# Patient Record
Sex: Male | Born: 1965 | Race: White | Hispanic: No | Marital: Married | Smoking: Never smoker
Health system: Southern US, Community
[De-identification: ages and names within clinical notes are randomized; demographics above are authoritative.]

## PROBLEM LIST (undated history)

## (undated) DIAGNOSIS — G44229 Chronic tension-type headache, not intractable: Secondary | ICD-10-CM

## (undated) DIAGNOSIS — R51 Headache: Secondary | ICD-10-CM

## (undated) DIAGNOSIS — T7840XA Allergy, unspecified, initial encounter: Secondary | ICD-10-CM

## (undated) DIAGNOSIS — C649 Malignant neoplasm of unspecified kidney, except renal pelvis: Secondary | ICD-10-CM

## (undated) HISTORY — DX: Malignant neoplasm of unspecified kidney, except renal pelvis: C64.9

## (undated) HISTORY — PX: SEPTOPLASTY: SUR1290

## (undated) HISTORY — DX: Chronic tension-type headache, not intractable: G44.229

## (undated) HISTORY — DX: Allergy, unspecified, initial encounter: T78.40XA

## (undated) HISTORY — DX: Headache: R51

---

## 2009-07-17 ENCOUNTER — Ambulatory Visit: Payer: Self-pay | Admitting: Internal Medicine

## 2009-07-17 DIAGNOSIS — R51 Headache: Secondary | ICD-10-CM

## 2009-07-17 DIAGNOSIS — R519 Headache, unspecified: Secondary | ICD-10-CM | POA: Insufficient documentation

## 2009-07-17 DIAGNOSIS — G44229 Chronic tension-type headache, not intractable: Secondary | ICD-10-CM

## 2009-07-17 DIAGNOSIS — J309 Allergic rhinitis, unspecified: Secondary | ICD-10-CM | POA: Insufficient documentation

## 2009-07-26 ENCOUNTER — Telehealth: Payer: Self-pay | Admitting: Internal Medicine

## 2009-08-17 ENCOUNTER — Ambulatory Visit: Payer: Self-pay | Admitting: Internal Medicine

## 2009-10-30 ENCOUNTER — Emergency Department (HOSPITAL_COMMUNITY): Admission: EM | Admit: 2009-10-30 | Discharge: 2009-10-31 | Payer: Self-pay | Admitting: Emergency Medicine

## 2009-10-30 ENCOUNTER — Encounter: Payer: Self-pay | Admitting: Internal Medicine

## 2009-10-31 ENCOUNTER — Encounter: Payer: Self-pay | Admitting: Internal Medicine

## 2009-10-31 ENCOUNTER — Telehealth: Payer: Self-pay | Admitting: Internal Medicine

## 2009-11-07 ENCOUNTER — Encounter: Payer: Self-pay | Admitting: Internal Medicine

## 2009-11-09 ENCOUNTER — Encounter: Payer: Self-pay | Admitting: Internal Medicine

## 2009-11-10 ENCOUNTER — Encounter: Payer: Self-pay | Admitting: Internal Medicine

## 2009-11-10 ENCOUNTER — Ambulatory Visit: Payer: Self-pay | Admitting: Critical Care Medicine

## 2009-11-10 ENCOUNTER — Telehealth: Payer: Self-pay | Admitting: Internal Medicine

## 2009-11-10 DIAGNOSIS — R930 Abnormal findings on diagnostic imaging of skull and head, not elsewhere classified: Secondary | ICD-10-CM

## 2009-11-21 DIAGNOSIS — C649 Malignant neoplasm of unspecified kidney, except renal pelvis: Secondary | ICD-10-CM | POA: Insufficient documentation

## 2009-11-22 ENCOUNTER — Telehealth: Payer: Self-pay | Admitting: Critical Care Medicine

## 2009-11-28 ENCOUNTER — Encounter: Payer: Self-pay | Admitting: Critical Care Medicine

## 2009-11-30 ENCOUNTER — Telehealth (INDEPENDENT_AMBULATORY_CARE_PROVIDER_SITE_OTHER): Payer: Self-pay | Admitting: *Deleted

## 2009-12-04 ENCOUNTER — Encounter: Payer: Self-pay | Admitting: Internal Medicine

## 2009-12-29 ENCOUNTER — Encounter: Payer: Self-pay | Admitting: Internal Medicine

## 2010-03-07 ENCOUNTER — Encounter: Payer: Self-pay | Admitting: Internal Medicine

## 2010-03-21 ENCOUNTER — Encounter: Payer: Self-pay | Admitting: Internal Medicine

## 2010-04-11 ENCOUNTER — Encounter: Payer: Self-pay | Admitting: Internal Medicine

## 2010-05-08 NOTE — Letter (Signed)
Summary: [facility]   Imported By: Lester [place] 11/17/2009 10:22:21  _____________________________________________________________________  External Attachment:    Type:   Image     Comment:   External Document

## 2010-05-08 NOTE — Letter (Signed)
Summary: Call A Nurse  Call A Nurse   Imported By: Sherian Rein 11/09/2009 13:41:38  _____________________________________________________________________  External Attachment:    Type:   Image     Comment:   External Document

## 2010-05-08 NOTE — Op Note (Signed)
Summary: [facility]   Imported By: Lester [place] 12/18/2009 10:26:37  _____________________________________________________________________  External Attachment:    Type:   Image     Comment:   External Document

## 2010-05-08 NOTE — Assessment & Plan Note (Signed)
Summary: Pulmonary Consultation   Copy to:  Dr. Izola Price Primary Provider/Referring Provider:  [name]  CC:  Pulmonary Consult for abnormal CT Chest.  Pt also scheduled for nephrectomy on August 29.  Brian Henson  History of Present Illness: Pulmonary Consultation  45 YO WM with new onset R renal mass.  Pt noted onset hematuria and renal colic like pain July 2011.  W/U revealed on Abd/pelvic CT renal mass.  Lower portion of lungs showed atelectasis and crowding of lung markings.  Full CT Chest taken times two did not reveal metastatic disease.  The pt was referred to confirm any abnormalities on CT Chest.    The is scheduled for nephrectomy at [facility] in [place] later in month of August.    Pt has prior hx of allergies/hayfever/ rhinitis.  No specific pulmonary history. The pt denies dyspnea, chest pain, cough, fever, chills, sweats, wheezing.  There is no hemoptysis. Pt denies any significant sore throat, nasal congestion or excess secretions, fever, chills, sweats, unintended weight loss, pleurtic or exertional chest pain, orthopnea PND, or leg swelling Pt denies any increase in rescue therapy over baseline, denies waking up needing it or having any early am or nocturnal exacerbations of coughing/wheezing/or dyspnea.    Preventive Screening-Counseling & Management  Alcohol-Tobacco     Smoking Status: never  Current Medications (verified): 1)  Xyzal 5 Mg Tabs (Levocetirizine Dihydrochloride) .... As Needed 2)  Singulair 10 Mg Tabs (Montelukast Sodium) .... As Needed 3)  Veramyst 27.5 Mcg/spray Susp (Fluticasone Furoate) .... As Needed 4)  Amrix 15 Mg Xr24h-Cap (Cyclobenzaprine Hcl) .... One By Mouth At Bedtime For Tension in Scalp As Needed  Allergies (verified): 1)  ! 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 Family History High cholesterol Family History Hypertension brother-sathma  Social History: Reviewed history from 07/17/2009 and no changes required. Occupation: Pensions consultant Married Never Smoked Alcohol use-yes 1 weekly Drug use-no Regular exercise-no  Review of Systems  The patient denies shortness of breath with activity, shortness of breath at rest, productive cough, non-productive cough, coughing up blood, chest pain, irregular heartbeats, acid heartburn, indigestion, loss of appetite, weight change, abdominal pain, difficulty swallowing, sore throat, tooth/dental problems, headaches, nasal congestion/difficulty breathing through nose, sneezing, itching, ear ache, anxiety, depression, hand/feet swelling, joint stiffness or pain, rash, change in color of mucus, and fever.    Vital Signs:  Patient profile:   45 year old male Height:      72 inches Weight:      262.38 pounds BMI:     35.71 O2 Sat:      97 % on Room air Temp:     98.2 degrees F oral Pulse rate:   77 / minute BP sitting:   136 / 86  (right arm) Cuff size:   large  Vitals Entered By: [name] RN (November 21, 2009 1:37 PM)  O2 Flow:  Room air CC: Pulmonary Consult for abnormal CT Chest.  Pt also scheduled for nephrectomy on August 29.   Comments Medications reviewed with patient Daytime contact number verified with patient. [name] RN  November 21, 2009 1:32 PM    Physical Exam  General:  normal appearance.   Nose:  no deformity, discharge, inflammation, or lesions Mouth:  no deformity or lesions Neck:  no masses,  thyromegaly, or abnormal cervical nodes Chest Wall:  no deformities noted Lungs:  clear bilaterally to auscultation and percussion Heart:  regular rate and rhythm, S1, S2 without murmurs, rubs, gallops, or clicks Abdomen:  bowel sounds positive; abdomen soft and non-tender without masses, or  organomegaly Msk:  no deformity or scoliosis noted with normal posture Pulses:  pulses normal Extremities:  no clubbing, cyanosis, edema, or deformity noted Neurologic:  CN II-XII grossly intact with normal reflexes, coordination, muscle strength and tone Skin:  intact without lesions or rashes Cervical Nodes:  no significant adenopathy Axillary Nodes:  no significant adenopathy Inguinal Nodes:  no significant adenopathy Psych:  alert and cooperative; normal mood and affect; normal attention span and concentration   CT of Chest  Procedure date:  11/13/2009  Findings:      3.6 cm x  2.9cm R hilum mass kidney   Lung crowding due to poor inspiratory effort with mosaic pattern This appears to be just poor inspiratory effort or expiratory view  Impression & Recommendations:  Problem # 1:  ABNORMAL CHEST XRAY (ICD-793.1) Assessment Unchanged The abnormalities seen on CT Chest do not represent anything other than poor inspiratory effort and do not represent either pneumonitis or metastatic process.  I find no active lung disease in this patient currently and I feel he can safely undergo a R nephrectomy  plan cleared medically for planned surgery no further pulmonary workup or treatment indicated return as needed   Medications Added to Medication List This Visit: 1)  Xyzal 5 Mg Tabs (Levocetirizine dihydrochloride) .... As needed 2)  Singulair 10 Mg Tabs (Montelukast sodium) .... As needed 3)  Veramyst 27.5 Mcg/spray Susp (Fluticasone furoate) .... As needed 4)  Amrix 15 Mg Xr24h-cap (Cyclobenzaprine hcl) .... One by mouth at bedtime for tension in scalp as needed  Complete Medication List: 1)  Xyzal 5 Mg Tabs (Levocetirizine dihydrochloride) .... As needed 2)  Singulair 10 Mg Tabs (Montelukast sodium) .... As needed 3)  Veramyst 27.5 Mcg/spray Susp (Fluticasone furoate) .... As needed 4)  Amrix 15 Mg Xr24h-cap (Cyclobenzaprine hcl) .... One by mouth at bedtime for tension in scalp  as needed   Immunization History:  Influenza Immunization History:    Influenza:  historical (02/06/2009)   Appended Document: Pulmonary Consultation pls fax to :  Dr. Maurene Capes at [facility]  (? I wonder if this is not "Renae Fickle" instead of [place] ??? Phone # [phone] Fax# [phone]

## 2010-05-08 NOTE — Consult Note (Signed)
Summary: [facility]   Imported By: Sherian Rein 11/15/2009 14:50:32  _____________________________________________________________________  External Attachment:    Type:   Image     Comment:   External Document

## 2010-05-08 NOTE — Progress Notes (Signed)
----   Converted from flag ---- ---- 11/10/2009 9:06 AM, Verdell Face wrote: Dr Yetta Barre,  Ephriam Knuckles JR is requesting a recommendation for a pulmonary MD from ( a name of one you would recommend) you & referral if needed.     [phone] Thayer Ohm (wife) Elnita Maxwell ------------------------------     Follow-up for Phone Call       Follow-up by: [name],  November 10, 2009 9:09 AM  New Problems: ABNORMAL CHEST XRAY (ICD-793.1)   New Problems: ABNORMAL CHEST XRAY (ICD-793.1)

## 2010-05-08 NOTE — Progress Notes (Signed)
  Phone Note Other Incoming   Caller: Pt Summary of Call: Pt was seen last night in the ER. Due to blood in his urine. The ER ran test and discovered a mass on pt's right kidney. Pt [facility] ref[facility] to [facility] and has an appt this am at 9am. Just an FYI Initial call taken by: Ami Bullins CMA,  October 31, 2009 9:01 AM

## 2010-05-08 NOTE — Miscellaneous (Signed)
Summary: Orders Update  Clinical Lists Changes  Orders: Added new Service order of New Patient Level V [phone]) - Signed

## 2010-05-08 NOTE — Assessment & Plan Note (Signed)
Summary: 1 MO ROV / NWS  #   Vital Signs:  Patient profile:   45 year old male Height:      72 inches Weight:      253 pounds BMI:     34.44 O2 Sat:      97 % on Room air Temp:     98.3 degrees F oral Pulse rate:   74 / minute Pulse rhythm:   regular Resp:     16 per minute BP sitting:   122 / 84  (left arm) Cuff size:   large  Vitals Entered By: Rock Nephew CMA (Aug 17, 2009 1:15 PM)  Nutrition Counseling: Patient's BMI is greater than 25 and therefore counseled on weight management options.  O2 Flow:  Room air CC: follow-up visit Is Patient Diabetic? No   Primary Care Provider:  [name]  CC:  follow-up visit.  History of Present Illness: He returns for f/up. The head and neck pain has responded well to Amrix but the discomfort will still flare up with stressors at work. He has mild allergy symptoms.  Preventive Screening-Counseling & Management  Alcohol-Tobacco     Alcohol drinks/day: <1     Alcohol type: beer     >5/day in last 3 mos: no     Alcohol Counseling: not indicated; use of alcohol is not excessive or problematic     Feels need to cut down: no     Feels annoyed by complaints: no     Feels guilty re: drinking: no     Needs 'eye opener' in am: no     Smoking Status: never  Hep-HIV-STD-Contraception     Hepatitis Risk: no risk noted     HIV Risk: no risk noted     STD Risk: no risk noted      Drug Use:  no.    Medications Prior to Update: 1)  Xyzal 5 Mg Tabs (Levocetirizine Dihydrochloride) 2)  Singulair 10 Mg Tabs (Montelukast Sodium) 3)  Veramyst 27.5 Mcg/spray Susp (Fluticasone Furoate) 4)  Amrix 15 Mg Xr24h-Cap (Cyclobenzaprine Hcl) .... One By Mouth At Bedtime For Tension in Scalp  Current Medications (verified): 1)  Xyzal 5 Mg Tabs (Levocetirizine Dihydrochloride) 2)  Singulair 10 Mg Tabs (Montelukast Sodium) 3)  Veramyst 27.5 Mcg/spray Susp (Fluticasone Furoate) 4)  Amrix 15 Mg Xr24h-Cap (Cyclobenzaprine Hcl) .... One By  Mouth At Bedtime For Tension in Scalp  Allergies (verified): 1)  ! Sulfa  Past History:  Past Medical History: Reviewed history from 07/17/2009 and no changes required. Allergic rhinitis Headache  Past Surgical History: Reviewed history from 07/17/2009 and no changes required. Sinus surgery  Family History: Reviewed history from 07/17/2009 and no changes required. Family History High cholesterol Family History Hypertension  Social History: Reviewed history from 07/17/2009 and no changes required. 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   ENT:  Complains of nasal congestion and postnasal drainage; denies decreased hearing, difficulty swallowing, ear discharge, earache, hoarseness, nosebleeds, ringing in ears, sinus pressure, and sore throat.  Physical Exam  General:  alert, well-developed, well-nourished, well-hydrated, appropriate dress, normal appearance, healthy-appearing, cooperative to examination, and overweight-appearing.   Ears:  R ear normal and L ear normal.   Nose:  no external deformity, no  airflow obstruction, no intranasal foreign body, no nasal polyps, no nasal mucosal lesions, no mucosal friability, no active bleeding or clots, no sinus percussion tenderness, no septum abnormalities, mucosal erythema, and mucosal edema.   Mouth:  Oral mucosa and oropharynx without lesions or exudates.  Teeth in good repair. Neck:  supple, full ROM, no masses, no thyromegaly, no JVD, normal carotid upstroke, no carotid bruits, no cervical lymphadenopathy, and no neck tenderness.   Lungs:  Normal respiratory effort, chest expands symmetrically. Lungs are clear to auscultation, no crackles or wheezes. Heart:  Normal rate and  regular rhythm. S1 and S2 normal without gallop, murmur, click, rub or other extra sounds. Abdomen:  Bowel sounds positive,abdomen soft and non-tender without masses, organomegaly or hernias noted. Msk:  No deformity or scoliosis noted of thoracic or lumbar spine.   Pulses:  R and L carotid,radial,femoral,dorsalis pedis and posterior tibial pulses are full and equal bilaterally Extremities:  No clubbing, cyanosis, edema, or deformity noted with normal full range of motion of all joints.   Neurologic:  No cranial nerve deficits noted. Station and gait are normal. Plantar reflexes are down-going bilaterally. DTRs are symmetrical throughout. Sensory, motor and coordinative functions appear intact. Skin:  Intact without suspicious lesions or rashes Psych:  Cognition and judgment appear intact. Alert and cooperative with normal attention span and concentration. 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 One by mouth at bedtime for tension in scalp  Patient Instructions: 1)  Please schedule a follow-up appointment in 2 months for a complete physical with fasting labs. Prescriptions: AMRIX 15 MG XR24H-CAP (CYCLOBENZAPRINE HCL) One by mouth at bedtime for tension in scalp  #30 x 11   Entered and Authorized by:   [name]   Signed by:   [name] on 08/17/2009   Method used:    Electronically to        [facility]  [phone]* (retail)       [address] [place], [place]  [address]       Ph: [phone] or [phone]       Fax: [phone]   RxID:   [id number]

## 2010-05-08 NOTE — Progress Notes (Signed)
     Follow-up for Phone Call       Follow-up by: [name],  November 10, 2009 9:09 AM

## 2010-05-08 NOTE — Letter (Signed)
[facility]   Imported By: Sherian Rein 11/15/2009 08:54:24  _____________________________________________________________________  External Attachment:    Type:   Image     Comment:   External Document

## 2010-05-08 NOTE — Progress Notes (Signed)
Summary: fax req- needs asap  Phone Note From Other Clinic   Caller: sharon w/ dr Timothy Lasso Call For: [place] Summary of Call: needs notes re: surgical clearance (surgery is scheduled for this monday 12/04/09. fax to the attn: dr Timothy Lasso- fax # [phone]. contact # for sharon is [phone] Initial call taken by: Tivis Ringer, CNA,  November 30, 2009 4:40 PM  Follow-up for Phone Call        Faxed notes.//Juanita Follow-up by: Darletta Moll,  December 01, 2009 8:31 AM

## 2010-05-08 NOTE — Assessment & Plan Note (Signed)
Summary: NEW [employer] STIFF NECK PT--PKG/OFF--STC   Vital Signs:  Patient profile:   45 year old male Height:      72 inches Weight:      257.25 pounds BMI:     35.02 O2 Sat:      96 % on Room air Temp:     97.6 degrees F oral Pulse rate:   77 / minute Pulse rhythm:   regular Resp:     16 per minute BP sitting:   122 / 82  (left arm) Cuff size:   large  Vitals Entered By: Rock Nephew CMA (July 17, 2009 2:51 PM)  Nutrition Counseling: Patient's BMI is greater than 25 and therefore counseled on weight management options.  O2 Flow:  Room air CC: Neck stiffness/pain x 4wks with Bilateral ear pain and pressure, Headache Is Patient Diabetic? No Pain Assessment Patient in pain? yes     Location: neck   Primary Care Provider:  [name]  CC:  Neck stiffness/pain x 4wks with Bilateral ear pain and pressure and Headache.  History of Present Illness: New to me he complains of pain and popping in both ears off/on for 2 months after being treated for a sinus infection.  He also has a one month hx. of tension/pain bilaterally in his upper neck/scalp over the occipital regions. The pain is symmetrical and non-radiating. He has not had any trauma or injury.  Headache HPI:      The patient comes in for an acute, first time visit for headaches.  The current headache started approximately 06/15/2009.  He has not had any previous headaches similar to the current one.  The headaches will last anywhere from 30 minutes to several days at a time.  There is a family history of migraine headaches.        The location of the headaches are bilateral and occipital.  Headache quality is pressure or tightness.  Aggravating factors include head movement.  Precipitating factors consist of stress.        Positive alarm features include change in features from prior H/A's and scalp tenderness.  The patient denies first or worst H/A of life, change in frequency from prior H/A's, change in severity  from prior H/A's, new onset H/A's in middle-age or later, new or progressive H/A lasting days, H/A's with Valsalva (cough/sneeze), mylagia, fever, malaise, weight loss, jaw claudication, focal neurologic symptoms, confusion, seizures, and impaired level of consciousness.         Headache Treatment History:      NSAID's were tried but not effective.  He has tried ibuprofen which was effective.     Preventive Screening-Counseling & Management  Alcohol-Tobacco     Alcohol drinks/day: <1     Alcohol type: beer     >5/day in last 3 mos: no     Alcohol Counseling: not indicated; use of alcohol is not excessive or problematic     Feels need to cut down: no     Feels annoyed by complaints: no     Feels guilty re: drinking: no     Needs 'eye opener' in am: no     Smoking Status: never  Caffeine-Diet-Exercise     Does Patient Exercise: no      Drug Use:  no.    Current Medications (verified): 1)  Xyzal 5 Mg Tabs (Levocetirizine Dihydrochloride) 2)  Singulair 10 Mg Tabs (Montelukast Sodium) 3)  Veramyst 27.5 Mcg/spray Susp (Fluticasone Furoate)  Allergies (verified): 1)  !  Sulfa  Past History:  Past Medical History: Allergic rhinitis Headache  Past Surgical History: Sinus surgery  Family History: Family History High cholesterol Family History Hypertension  Social History: Occupation: Pensions consultant Married Never Smoked Alcohol use-yes Drug use-no Regular exercise-no Smoking Status:  never Drug Use:  no Does Patient Exercise:  no  Review of Systems ENT:  Complains of earache, nasal congestion, postnasal drainage, ringing in ears, and sinus pressure; denies decreased hearing, difficulty swallowing, ear discharge, nosebleeds, and sore throat.  Physical Exam  General:  alert, well-developed, well-nourished, well-hydrated, appropriate dress, normal appearance, healthy-appearing, cooperative to examination, and overweight-appearing.   Head:  normocephalic, atraumatic, no  abnormalities observed, and no abnormalities palpated.   Eyes:  vision grossly intact, pupils equal, pupils round, and pupils reactive to light.   Ears:  R ear normal and L ear normal.   Nose:  no external deformity, no airflow obstruction, no intranasal foreign body, no nasal polyps, no nasal mucosal lesions, no mucosal friability, no active bleeding or clots, no sinus percussion tenderness, no septum abnormalities, mucosal erythema, and mucosal edema.   Mouth:  Oral mucosa and oropharynx without lesions or exudates.  Teeth in good repair. Neck:  supple, full ROM, no masses, no thyromegaly, no JVD, normal carotid upstroke, no carotid bruits, no cervical lymphadenopathy, and no neck tenderness.   Lungs:  Normal respiratory effort, chest expands symmetrically. Lungs are clear to auscultation, no crackles or wheezes. Heart:  Normal rate and regular rhythm. S1 and S2 normal without gallop, murmur, click, rub or other extra sounds. Abdomen:  Bowel sounds positive,abdomen soft and non-tender without masses, organomegaly or hernias noted. Msk:  No deformity or scoliosis noted of thoracic or lumbar spine.   Pulses:  R and L carotid,radial,femoral,dorsalis pedis and posterior tibial pulses are full and equal bilaterally Extremities:  No clubbing, cyanosis, edema, or deformity noted with normal full range of motion of all joints.   Neurologic:  No cranial nerve deficits noted. Station and gait are normal. Plantar reflexes are down-going bilaterally. DTRs are symmetrical throughout. Sensory, motor and coordinative functions appear intact. Skin:  Intact without suspicious lesions or rashes Cervical Nodes:  no anterior cervical adenopathy and no posterior cervical adenopathy.   Axillary Nodes:  no R axillary adenopathy and no L axillary adenopathy.   Inguinal Nodes:  no R inguinal adenopathy and no L inguinal adenopathy.   Psych:  Cognition and judgment appear intact. Alert and cooperative with normal attention  span and concentration. No apparent delusions, illusions, hallucinations  Headache Neurologic Exam:    Cranial Nerves Intact:  Yes    Focal Neurologic Deficit:  No    Motor Exam/Strength-Left:       Left Biceps Flexion ( ):  normal       Left Triceps Extension ( ):  normal       Left Hand Grasp ( ):  normal       Left Deltoid ( ):  normal       Left Hip Flexors ( ):  normal       Left Ankle Dorsiflexion (L5,L4):  normal       Left Great Toe Dorsiflexion (L5,L4):  normal       Left Heel Walk (L5,some L4):  normal       Left Single Squat & Rise-Quads (L4):  normal       Left Toe Walk-calf (S1):  normal    Motor Exam/Strength-Right:       Right Biceps Flexion ( ):  normal       Right Triceps Extension ( ):  normal       Right Hand Grasp ( ):  normal       Right Deltoid ( ):  normal       Right Hip Flexors ( ):  normal       Right Ankle Dorsiflexion (L5,L4):  normal       Right Great Toe Dorsiflexion (L5,L4):  normal       Right Heel Walk (L5,some L4):  normal       Right Single Squat & Rise-Quads (L4):  normal       Right Toe Walk-calf (S1):  normal    Reflexes-Left:       Left Biceps ( ):  normal       Left Triceps ( ):  normal       Left Brachioradialis ( ):  normal       Left Knee Jerk (L4):  normal       Left Ankle Reflex (S1):  normal    Reflexes-Right:       Right Biceps ( ):  normal       Right Triceps ( ):  normal       Right Brachioradialis ( ):  normal       Right Knee Jerk (L4):  normal       Right Ankle Reflex (S1):  normal   Impression & Recommendations:  Problem # 1:  ALLERGIC RHINITIS (ICD-477.9) Assessment New  he has eustachian tube dysfunction so will try depo-medrol and continue xyzal and veramyst. His updated medication list for this problem includes:    Xyzal 5 Mg Tabs (Levocetirizine dihydrochloride)    Veramyst 27.5 Mcg/spray Susp (Fluticasone furoate)  Orders: Admin of Therapeutic Inj  intramuscular or subcutaneous (21308) Depo- Medrol 40mg   (J1030) Depo- Medrol 80mg  (J1040)  Problem # 2:  CHRONIC TENSION HEADACHE (ICD-339.12) try amrix once daily as needed   Complete Medication List: 1)  Xyzal 5 Mg Tabs (Levocetirizine dihydrochloride) 2)  Singulair 10 Mg Tabs (Montelukast sodium) 3)  Veramyst 27.5 Mcg/spray Susp (Fluticasone furoate) 4)  Amrix 15 Mg Xr24h-cap (Cyclobenzaprine hcl) .... One by mouth at bedtime for tension in scalp  Headache Assessment/Plan:    Headache Classification:  Tension headache  Patient Instructions: 1)  Please schedule a follow-up appointment in 1 month. Prescriptions: AMRIX 15 MG XR24H-CAP (CYCLOBENZAPRINE HCL) One by mouth at bedtime for tension in scalp  #20 x 0   Entered and Authorized by:   [name]   Signed by:   [name] on 07/17/2009   Method used:   Samples Given   RxID:   [id number]    Medication Administration  Injection # 1:    Medication: Depo- Medrol 80mg     Diagnosis: ALLERGIC RHINITIS (ICD-477.9)    Route: IM    Site: R deltoid    Exp Date: 02/2012    Lot #: [id number]    Mfr: pfizer    Patient tolerated injection without complications    Given by: Rock Nephew CMA (July 17, 2009 3:29 PM)  Injection # 2:    Medication: Depo- Medrol 40mg     Diagnosis: ALLERGIC RHINITIS (ICD-477.9)    Site: R deltoid    Exp Date: 02/2012    Lot #: [id number]    Mfr: pfizer    Patient tolerated injection without complications    Given by: Rock Nephew CMA (July 17, 2009 3:29 PM)  Orders  Added: 1)  Admin of Therapeutic Inj  intramuscular or subcutaneous [96372] 2)  Depo- Medrol 40mg  [J1030] 3)  Depo- Medrol 80mg  [J1040] 4)  New Patient Level IV [04540]

## 2010-05-08 NOTE — Letter (Signed)
Summary: [facility]   Imported By: Lennie Odor 03/12/2010 14:59:40  _____________________________________________________________________  External Attachment:    Type:   Image     Comment:   External Document

## 2010-05-08 NOTE — Letter (Signed)
Summary: [facility]   Imported By: Sherian Rein 11/03/2009 12:15:49  _____________________________________________________________________  External Attachment:    Type:   Image     Comment:   External Document

## 2010-05-08 NOTE — Progress Notes (Signed)
Summary: Contact for urology in [place]  ---- Converted from flag ---- ---- 11/22/2009 4:31 PM, [name] RN wrote: Pt dropped this info off and said you were requesting it-- Dr. Maurene Capes at [facility] Phone # [phone] Fax# [phone] ------------------------------

## 2010-05-08 NOTE — Letter (Signed)
Summary: [facility]  [facility]   Imported By: Sherian Rein 01/05/2010 09:00:32  _____________________________________________________________________  External Attachment:    Type:   Image     Comment:   External Document

## 2010-05-08 NOTE — Progress Notes (Signed)
Summary: Strep  Phone Note Call from Patient Call back at [phone] or [phone]   Summary of Call: Pt's children have strep and coxsackie virus. Pt has fever, 101, starting last night. Does pt needs office visit for eval? or would MD send in RX? Initial call taken by: Lamar Sprinkles, CMA,  July 26, 2009 8:57 AM  Follow-up for Phone Call        will send in an Rx for strep but coxsackievirus does not have a treatment Follow-up by: [name],  July 26, 2009 9:04 AM  Additional Follow-up for Phone Call Additional follow up Details #1::        Pt informed  Additional Follow-up by: Lamar Sprinkles, CMA,  July 26, 2009 9:17 AM    New/Updated Medications: AMOXICILLIN 500 MG CAP (AMOXICILLIN) Take 1 capsule by mouth three times a day X 10 days Prescriptions: AMOXICILLIN 500 MG CAP (AMOXICILLIN) Take 1 capsule by mouth three times a day X 10 days  #30 x 0   Entered and Authorized by:   [name]   Signed by:   [name] on 07/26/2009   Method used:   Electronically to        [facility]  [phone]* (retail)       [address] [place], [place]  [address]       Ph: [phone] or [phone]       Fax: [phone]   RxID:   [id number]

## 2010-05-10 NOTE — Letter (Signed)
Summary: [facility]  [facility]   Imported By: Lennie Odor 04/17/2010 14:05:57  _____________________________________________________________________  External Attachment:    Type:   Image     Comment:   External Document

## 2010-05-10 NOTE — Letter (Signed)
Summary: [facility]  [facility]   Imported By: Lester McComb 04/03/2010 10:26:05  _____________________________________________________________________  External Attachment:    Type:   Image     Comment:   External Document

## 2010-06-23 LAB — URINALYSIS, ROUTINE W REFLEX MICROSCOPIC
Protein, ur: 100 mg/dL — AB
Urobilinogen, UA: 1 mg/dL (ref 0.0–1.0)
pH: 5 (ref 5.0–8.0)

## 2010-06-23 LAB — URINE MICROSCOPIC-ADD ON

## 2010-06-23 LAB — URINE CULTURE: Culture: NO GROWTH

## 2011-07-16 ENCOUNTER — Ambulatory Visit (INDEPENDENT_AMBULATORY_CARE_PROVIDER_SITE_OTHER): Payer: 59 | Admitting: Internal Medicine

## 2011-07-16 ENCOUNTER — Encounter: Payer: Self-pay | Admitting: Internal Medicine

## 2011-07-16 ENCOUNTER — Ambulatory Visit (INDEPENDENT_AMBULATORY_CARE_PROVIDER_SITE_OTHER)
Admission: RE | Admit: 2011-07-16 | Discharge: 2011-07-16 | Disposition: A | Discharge status: Home / Self Care | Payer: 59 | Source: Ambulatory Visit | Attending: Internal Medicine | Admitting: Internal Medicine

## 2011-07-16 VITALS — BP 126/84 | HR 90 | Temp 100.2°F | Resp 16 | Wt 261.0 lb

## 2011-07-16 DIAGNOSIS — J209 Acute bronchitis, unspecified: Secondary | ICD-10-CM

## 2011-07-16 DIAGNOSIS — J039 Acute tonsillitis, unspecified: Secondary | ICD-10-CM

## 2011-07-16 DIAGNOSIS — R05 Cough: Secondary | ICD-10-CM

## 2011-07-16 MED ORDER — GUAIFENESIN-CODEINE 100-10 MG/5ML PO SYRP: 5.0000 mL | ORAL_SOLUTION | Freq: Three times a day (TID) | ORAL | Status: AC | PRN

## 2011-07-16 MED ORDER — CEFTRIAXONE SODIUM 500 MG IJ SOLR
500.0000 mg | Freq: Once | INTRAMUSCULAR | Status: AC
  Administered 2011-07-16: 500 mg via INTRAMUSCULAR

## 2011-07-16 MED ORDER — AMOXICILLIN-POT CLAVULANATE 875-125 MG PO TABS: 1.0000 | ORAL_TABLET | Freq: Two times a day (BID) | ORAL | Status: AC

## 2011-07-16 NOTE — Patient Instructions (Signed)
Acute Bronchitis You have acute bronchitis. This means you have a chest cold. The airways in your lungs are red and sore (inflamed). Acute means it is sudden onset.  CAUSES Bronchitis is most often caused by the same virus that causes a cold. SYMPTOMS   Body aches.   Chest congestion.   Chills.   Cough.   Fever.   Shortness of breath.   Sore throat.  TREATMENT  Acute bronchitis is usually treated with rest, fluids, and medicines for relief of fever or cough. Most symptoms should go away after a few days or a week. Increased fluids may help thin your secretions and will prevent dehydration. Your caregiver may give you an inhaler to improve your symptoms. The inhaler reduces shortness of breath and helps control cough. You can take over-the-counter pain relievers or cough medicine to decrease coughing, pain, or fever. A cool-air vaporizer may help thin bronchial secretions and make it easier to clear your chest. Antibiotics are usually not needed but can be prescribed if you smoke, are seriously ill, have chronic lung problems, are elderly, or you are at higher risk for developing complications.Allergies and asthma can make bronchitis worse. Repeated episodes of bronchitis may cause longstanding lung problems. Avoid smoking and secondhand smoke.Exposure to cigarette smoke or irritating chemicals will make bronchitis worse. If you are a cigarette smoker, consider using nicotine gum or skin patches to help control withdrawal symptoms. Quitting smoking will help your lungs heal faster. Recovery from bronchitis is often slow, but you should start feeling better after 2 to 3 days. Cough from bronchitis frequently lasts for 3 to 4 weeks. To prevent another bout of acute bronchitis:  Quit smoking.   Wash your hands frequently to get rid of viruses or use a hand sanitizer.   Avoid other people with cold or virus symptoms.   Try not to touch your hands to your mouth, nose, or eyes.  SEEK  IMMEDIATE MEDICAL CARE IF:  You develop increased fever, chills, or chest pain.   You have severe shortness of breath or bloody sputum.   You develop dehydration, fainting, repeated vomiting, or a severe headache.   You have no improvement after 1 week of treatment or you get worse.  MAKE SURE YOU:   Understand these instructions.   Will watch your condition.   Will get help right away if you are not doing well or get worse.  Document Released: 05/02/2004 Document Revised: 03/14/2011 Document Reviewed: 07/18/2010 [facility] Patient Information 2012 [place], [place].Tonsillitis Tonsils are lumps of lymphoid tissues at the back of the throat. Each tonsil has 20 crevices (crypts). Tonsils help fight nose and throat infections and keep infection from spreading to other parts of the body for the first 18 months of life. Tonsillitis is an infection of the throat that causes the tonsils to become red, tender, and swollen. CAUSES Sudden and, if treated, temporary (acute) tonsillitis is usually caused by infection with streptococcal bacteria. Long lasting (chronic) tonsillitis occurs when the crypts of the tonsils become filled with pieces of food and bacteria, which makes it easy for the tonsils to become constantly infected. SYMPTOMS  Symptoms of tonsillitis include:  A sore throat.   White patches on the tonsils.   Fever.   Tiredness.  DIAGNOSIS Tonsillitis can be diagnosed through a physical exam. Diagnosis can be confirmed with the results of lab tests, including a throat culture. TREATMENT  The goals of tonsillitis treatment include the reduction of the severity and duration of symptoms, prevention  of associated conditions, and prevention of disease transmission. Tonsillitis caused by bacteria can be treated with antibiotics. Usually, treatment with antibiotics is started before the cause of the tonsillitis is known. However, if it is determined that the cause is not bacterial,  antibiotics will not treat the tonsillitis. If attacks of tonsillitis are severe and frequent, your caregiver may recommend surgery to remove the tonsils (tonsillectomy). HOME CARE INSTRUCTIONS   Rest as much as possible and get plenty of sleep.   Drink plenty of fluids. While the throat is very sore, eat soft foods or liquids, such as sherbet, soups, or instant breakfast drinks.   Eat frozen ice pops.   Older children and adults may gargle with a warm or cold liquid to help soothe the throat. Mix 1 teaspoon of salt in 1 cup of water.   Other family members who also develop a sore throat or fever should have a medical exam or throat culture.   Only take over-the-counter or prescription medicines for pain, discomfort, or fever as directed by your caregiver.   If you are given antibiotics, take them as directed. Finish them even if you start to feel better.  SEEK MEDICAL CARE IF:   Your baby is older than 3 months with a rectal temperature of 100.5 F (38.1 C) or higher for more than 1 day.   Large, tender lumps develop in your neck.   A rash develops.   Green, yellow-brown, or bloody substance is coughed up.   You are unable to swallow liquids or food for 24 hours.   Your child is unable to swallow food or liquids for 12 hours.  SEEK IMMEDIATE MEDICAL CARE IF:   You develop any new symptoms such as vomiting, severe headache, stiff neck, chest pain, or trouble breathing or swallowing.   You have severe throat pain along with drooling or voice changes.   You have severe pain, unrelieved with recommended medications.   You are unable to fully open the mouth.   You develop redness, swelling, or severe pain anywhere in the neck.   You have a fever.   Your baby is older than 3 months with a rectal temperature of 102 F (38.9 C) or higher.   Your baby is 54 months old or younger with a rectal temperature of 100.4 F (38 C) or higher.  MAKE SURE YOU:   Understand these  instructions.   Will watch your condition.   Will get help right away if you are not doing well or get worse.  Document Released: 01/02/2005 Document Revised: 03/14/2011 Document Reviewed: 05/31/2010 [facility] Patient Information 2012 [place], [place].

## 2011-07-17 NOTE — Progress Notes (Signed)
Subjective:    Patient ID: Brian Henson, male    DOB: 09-26-1965, 46 y.o.   MRN: [id number]  Sinusitis This is a new problem. The current episode started in the past 7 days. The problem has been gradually worsening since onset. The maximum temperature recorded prior to his arrival was 100 - 100.9 F. The fever has been present for 1 to 2 days. His pain is at a severity of 1/10. The pain is mild. Associated symptoms include chills, congestion, coughing, sinus pressure, sneezing and a sore throat. Pertinent negatives include no diaphoresis, headaches or shortness of breath. Past treatments include nothing.  Cough This is a new problem. The current episode started in the past 7 days. The problem has been unchanged. The cough is non-productive. Associated symptoms include chills, a fever, myalgias and a sore throat. Pertinent negatives include no chest pain, ear congestion, headaches, heartburn, hemoptysis, nasal congestion, postnasal drip, rash, rhinorrhea, shortness of breath, sweats, weight loss or wheezing.      Review of Systems  Constitutional: Positive for fever, chills and fatigue. Negative for weight loss, diaphoresis, activity change, appetite change and unexpected weight change.  HENT: Positive for congestion, sore throat, sneezing and sinus pressure. Negative for nosebleeds, rhinorrhea, drooling, mouth sores, trouble swallowing, dental problem, voice change and postnasal drip.   Eyes: Negative.   Respiratory: Positive for cough. Negative for apnea, hemoptysis, choking, chest tightness, shortness of breath, wheezing and stridor.   Cardiovascular: Negative for chest pain, palpitations and leg swelling.  Gastrointestinal: Negative for heartburn, nausea, vomiting, abdominal pain, diarrhea, constipation, blood in stool and abdominal distention.  Genitourinary: Negative.   Musculoskeletal: Positive for myalgias. Negative for joint swelling, arthralgias and gait problem.  Skin: Negative for  color change, pallor, rash and wound.  Neurological: Negative for dizziness, tremors, seizures, syncope, facial asymmetry, speech difficulty, weakness, light-headedness, numbness and headaches.  Hematological: Negative for adenopathy. Does not bruise/bleed easily.  Psychiatric/Behavioral: Negative.        Objective:   Physical Exam  Vitals reviewed. Constitutional: He is oriented to person, place, and time. He appears well-developed and well-nourished.  Non-toxic appearance. He does not have a sickly appearance. He appears ill. No distress.  HENT:  Head: Normocephalic and atraumatic. No trismus in the jaw.  Right Ear: Hearing, tympanic membrane, external ear and ear canal normal.  Left Ear: Hearing, tympanic membrane, external ear and ear canal normal.  Nose: No mucosal edema or rhinorrhea. Right sinus exhibits no maxillary sinus tenderness and no frontal sinus tenderness. Left sinus exhibits no maxillary sinus tenderness and no frontal sinus tenderness.  Mouth/Throat: Mucous membranes are normal. Mucous membranes are not pale, not dry and not cyanotic. No uvula swelling. Posterior oropharyngeal edema (moderate bilateral tonsillar hypertrophy) and posterior oropharyngeal erythema present. No oropharyngeal exudate or tonsillar abscesses.  Eyes: Conjunctivae are normal. Right eye exhibits no discharge. Left eye exhibits no discharge. No scleral icterus.  Neck: Normal range of motion. Neck supple. No JVD present. No tracheal deviation present. No thyromegaly present.  Cardiovascular: Normal rate, regular rhythm, normal heart sounds and intact distal pulses.  Exam reveals no gallop and no friction rub.   No murmur heard. Pulmonary/Chest: Effort normal and breath sounds normal. No stridor. No respiratory distress. He has no wheezes. He has no rales. He exhibits no tenderness.  Abdominal: Soft. Bowel sounds are normal. He exhibits no distension and no mass. There is no tenderness. There is no rebound  and no guarding.  Musculoskeletal: Normal range of motion. He exhibits  no edema and no tenderness.  Lymphadenopathy:       Head (right side): No submental, no submandibular, no tonsillar, no preauricular, no posterior auricular and no occipital adenopathy present.       Head (left side): No submental, no submandibular, no tonsillar, no preauricular and no posterior auricular adenopathy present.    He has cervical adenopathy.       Right cervical: Superficial cervical adenopathy present. No deep cervical and no posterior cervical adenopathy present.      Left cervical: Superficial cervical adenopathy present. No deep cervical and no posterior cervical adenopathy present.    He has no axillary adenopathy.  Neurological: He is oriented to person, place, and time.  Skin: Skin is warm and dry. No rash noted. He is not diaphoretic. No erythema. No pallor.  Psychiatric: He has a normal mood and affect. His behavior is normal. Judgment and thought content normal.          Assessment & Plan:

## 2011-07-17 NOTE — Assessment & Plan Note (Signed)
I will check his CXR today to look for pna, mass, edema 

## 2011-07-17 NOTE — Assessment & Plan Note (Signed)
He has classic signs of strep tonsillitis so I gave him Rocephin IM here in the office and will continue antibiotics with augmentin

## 2011-07-17 NOTE — Assessment & Plan Note (Signed)
Start augmentin and a cough suppressant 

## 2011-07-26 ENCOUNTER — Telehealth: Payer: Self-pay | Admitting: *Deleted

## 2011-07-26 MED ORDER — AZITHROMYCIN 500 MG PO TABS: 500.0000 mg | ORAL_TABLET | Freq: Every day | ORAL | Status: AC

## 2011-07-26 NOTE — Telephone Encounter (Signed)
Pt informed

## 2011-07-26 NOTE — Telephone Encounter (Signed)
I can add Zpak to the augmentin, Rx has been sent in

## 2011-07-26 NOTE — Telephone Encounter (Signed)
Pt left vm stating he has productive cough with yellow mucus and 101.5 fever. He knows he has a f/u in 2 wks but wants to know if he needs to be seen sooner or other meds. Please advise.

## 2011-08-02 ENCOUNTER — Encounter: Payer: Self-pay | Admitting: Internal Medicine

## 2011-08-02 ENCOUNTER — Ambulatory Visit (INDEPENDENT_AMBULATORY_CARE_PROVIDER_SITE_OTHER): Payer: 59 | Admitting: Internal Medicine

## 2011-08-02 VITALS — BP 120/86 | HR 76 | Temp 97.3°F | Resp 16 | Wt 261.0 lb

## 2011-08-02 DIAGNOSIS — J45901 Unspecified asthma with (acute) exacerbation: Secondary | ICD-10-CM

## 2011-08-02 MED ORDER — FLUTICASONE-SALMETEROL 250-50 MCG/DOSE IN AEPB: 1.0000 | INHALATION_SPRAY | Freq: Two times a day (BID) | RESPIRATORY_TRACT | Status: AC

## 2011-08-02 MED ORDER — METHYLPREDNISOLONE ACETATE 80 MG/ML IJ SUSP
120.0000 mg | Freq: Once | INTRAMUSCULAR | Status: AC
  Administered 2011-08-02: 120 mg via INTRAMUSCULAR

## 2011-08-02 NOTE — Patient Instructions (Signed)

## 2011-08-04 ENCOUNTER — Encounter: Payer: Self-pay | Admitting: Internal Medicine

## 2011-08-04 NOTE — Progress Notes (Signed)
  Subjective:    Patient ID: Brian Henson, male    DOB: [birth year], 46 y.o.   MRN: [id number]  Cough This is a recurrent problem. The current episode started 1 to 4 weeks ago. The problem has been unchanged. The problem occurs every few hours. The cough is non-productive. Associated symptoms include nasal congestion, postnasal drip, shortness of breath and wheezing. Pertinent negatives include no chest pain, chills, ear congestion, ear pain, fever, headaches, heartburn, hemoptysis, myalgias, rhinorrhea, sore throat, sweats or weight loss. The symptoms are aggravated by cold air. Treatments tried: zpak and augmentin. The treatment provided mild relief. His past medical history is significant for environmental allergies.      Review of Systems  Constitutional: Negative for fever, chills, weight loss, diaphoresis, activity change, appetite change, fatigue and unexpected weight change.  HENT: Positive for postnasal drip. Negative for hearing loss, ear pain, nosebleeds, congestion, sore throat, facial swelling, rhinorrhea, sneezing, drooling, mouth sores, trouble swallowing, neck pain, neck stiffness, dental problem, voice change, sinus pressure, tinnitus and ear discharge.   Eyes: Negative.   Respiratory: Positive for cough, shortness of breath and wheezing. Negative for apnea, hemoptysis, choking, chest tightness and stridor.   Cardiovascular: Negative for chest pain, palpitations and leg swelling.  Gastrointestinal: Negative.  Negative for heartburn.  Genitourinary: Negative.   Musculoskeletal: Negative for myalgias, back pain, joint swelling, arthralgias and gait problem.  Skin: Negative for color change and pallor.  Neurological: Negative for dizziness, tremors, seizures, syncope, facial asymmetry, speech difficulty, weakness, light-headedness, numbness and headaches.  Hematological: Positive for environmental allergies. Negative for adenopathy. Does not bruise/bleed easily.    Psychiatric/Behavioral: Negative.        Objective:   Physical Exam  Vitals reviewed. Constitutional: He is oriented to person, place, and time. He appears well-developed and well-nourished. No distress.  HENT:  Head: Normocephalic and atraumatic.  Mouth/Throat: Oropharynx is clear and moist. No oropharyngeal exudate.  Eyes: Conjunctivae are normal. Right eye exhibits no discharge. Left eye exhibits no discharge. No scleral icterus.  Neck: Normal range of motion. Neck supple. No JVD present. No tracheal deviation present. No thyromegaly present.  Cardiovascular: Normal rate, regular rhythm, normal heart sounds and intact distal pulses.  Exam reveals no gallop and no friction rub.   No murmur heard. Pulmonary/Chest: Effort normal. No accessory muscle usage or stridor. Not tachypneic. No respiratory distress. He has no decreased breath sounds. He has wheezes in the right middle field and the left middle field. He has rhonchi in the right middle field and the left middle field. He has no rales. He exhibits no tenderness.  Abdominal: Soft. Bowel sounds are normal. He exhibits no distension and no mass. There is no tenderness. There is no rebound and no guarding.  Musculoskeletal: Normal range of motion. He exhibits no edema and no tenderness.  Lymphadenopathy:    He has no cervical adenopathy.  Neurological: He is oriented to person, place, and time.  Skin: Skin is warm and dry. No rash noted. He is not diaphoretic. No erythema. No pallor.  Psychiatric: He has a normal mood and affect. His behavior is normal. Judgment and thought content normal.          Assessment & Plan:

## 2011-08-04 NOTE — Assessment & Plan Note (Signed)
He has s/s c/w asthma so I gave him an injection of depo-medrol IM and started him on advair diskus

## 2011-08-07 ENCOUNTER — Ambulatory Visit: Payer: 59 | Admitting: Internal Medicine

## 2011-08-28 IMAGING — CT CT ABD-PELV W/O CM
2 of 4 series · 17 of 46 positions shown, 19 images · non-contrast
Comparison: None.

CLINICAL DATA: Right flank pain, hematuria

CT ABDOMEN AND PELVIS WITHOUT CONTRAST
TECHNIQUE: Multidetector CT imaging of the abdomen and pelvis was
performed following the standard protocol without intravenous
contrast.

[Series 2: renal stone · axial · 0.80mm/px · z∈[-566,-41]mm · 14 of 114 slices shown, 16 images]
[im 5/114  soft-tissue]
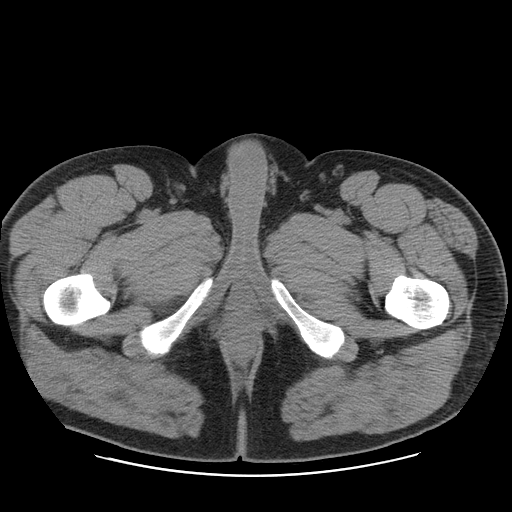
[im 5/114  bone]
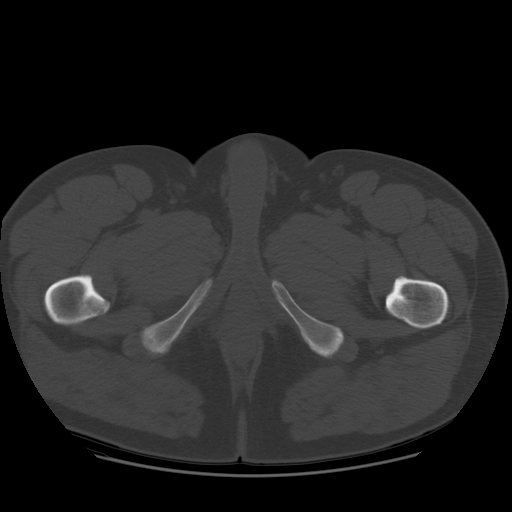
[im 14/114  soft-tissue]
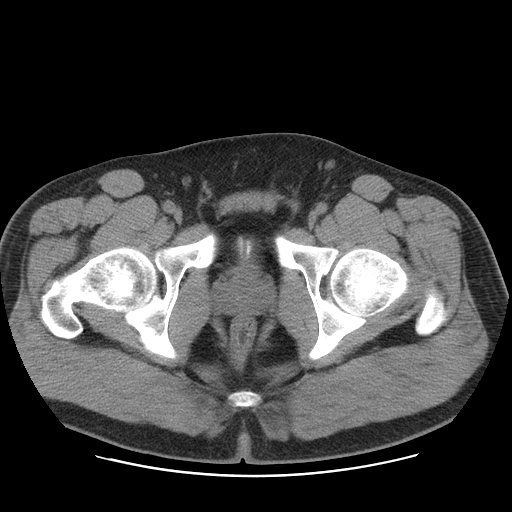
[im 22/114  soft-tissue]
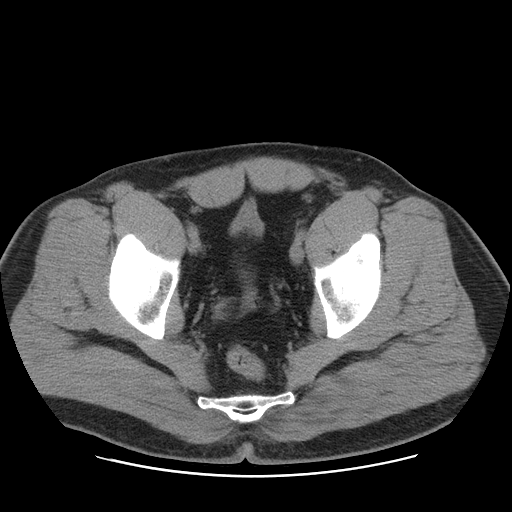
[im 31/114  soft-tissue]
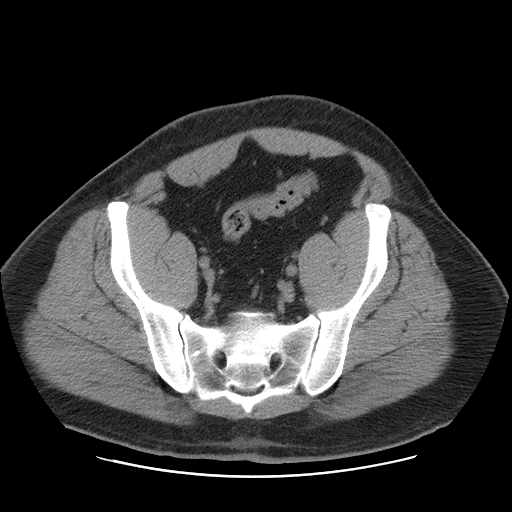
[im 40/114  soft-tissue]
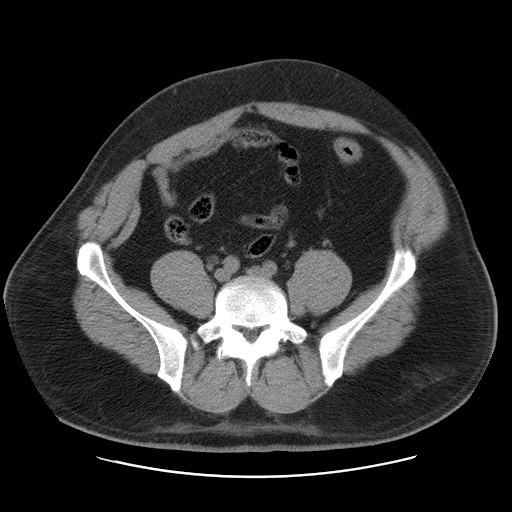
[im 44/114  soft-tissue]
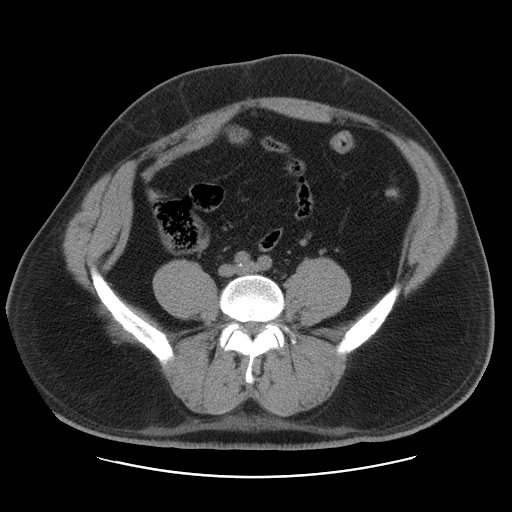
[im 53/114  soft-tissue]
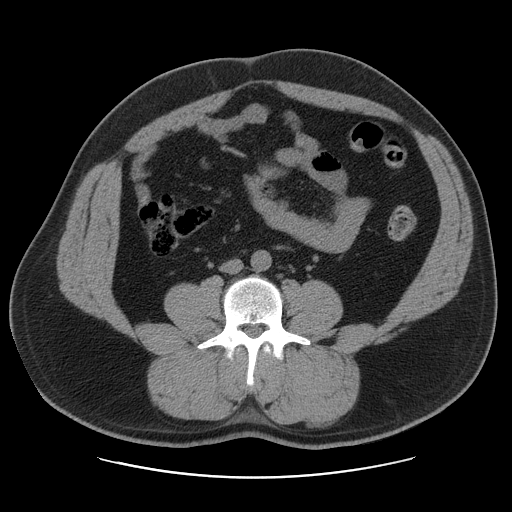
[im 61/114  soft-tissue]
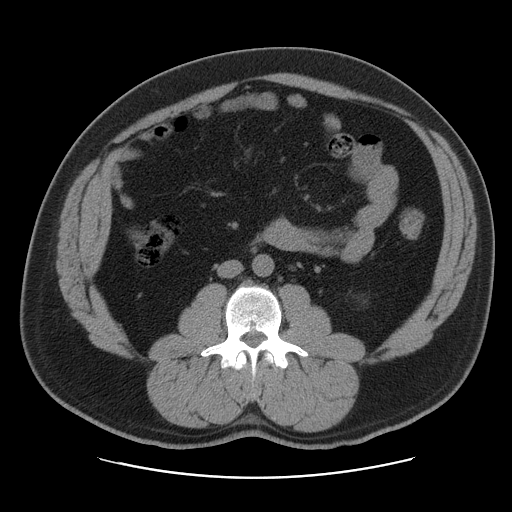
[im 70/114  soft-tissue]
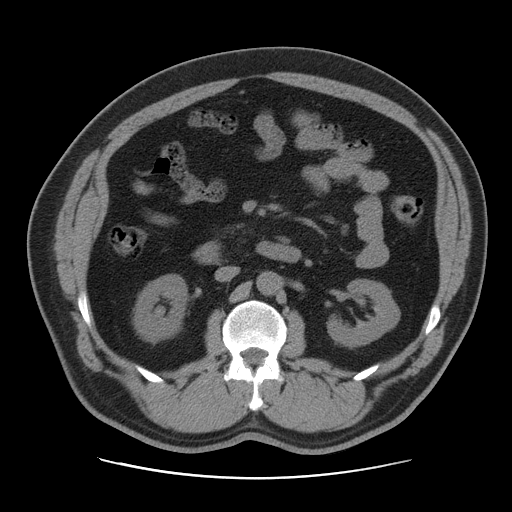
[im 70/114  bone]
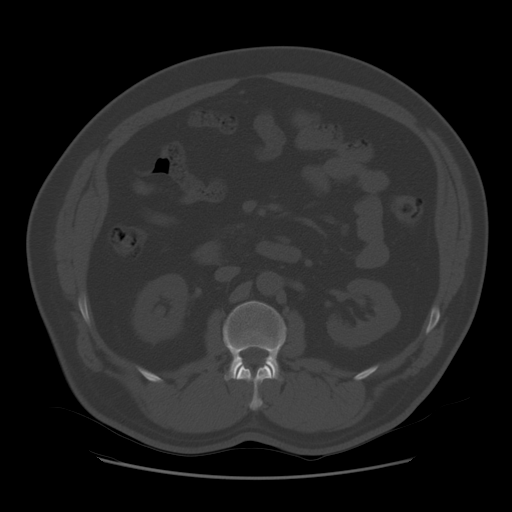
[im 74/114  soft-tissue]
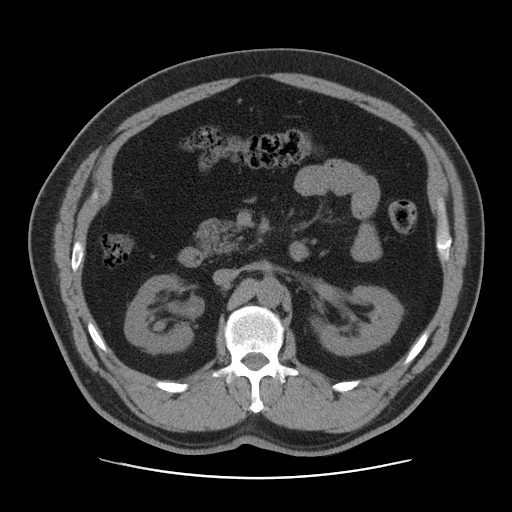
[im 83/114  soft-tissue]
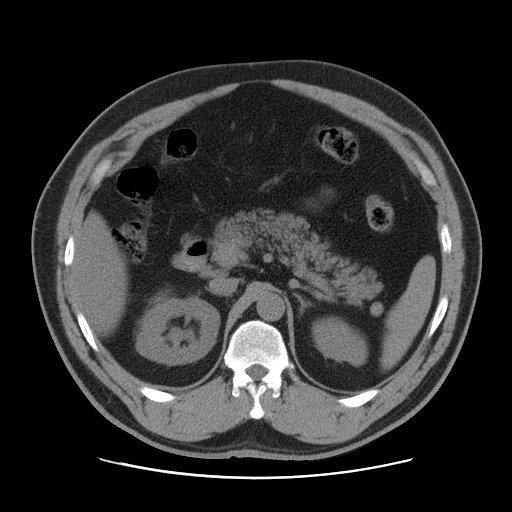
[im 92/114  soft-tissue]
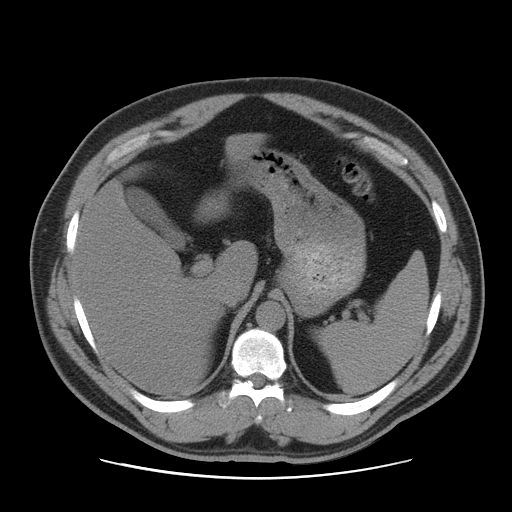
[im 100/114  soft-tissue]
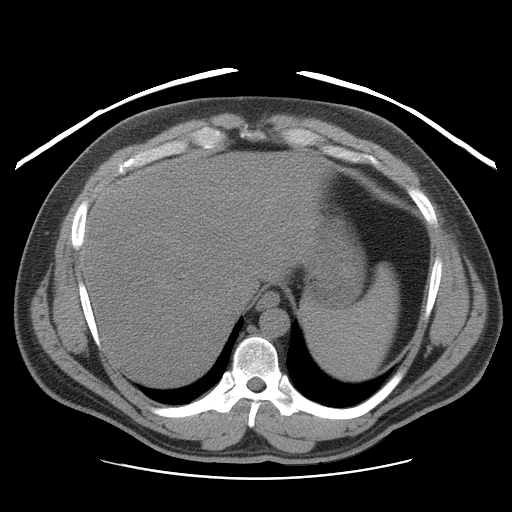
[im 109/114  soft-tissue]
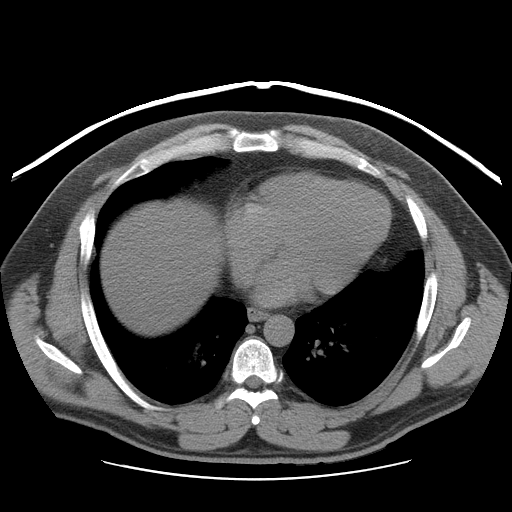

[Series 401: reformatted · coronal · 1.15mm/px · 3 of 138 slices shown]
[im 46/138  soft-tissue]
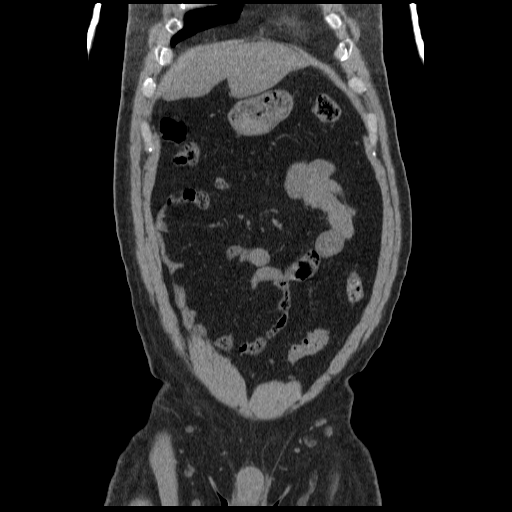
[im 61/138  soft-tissue]
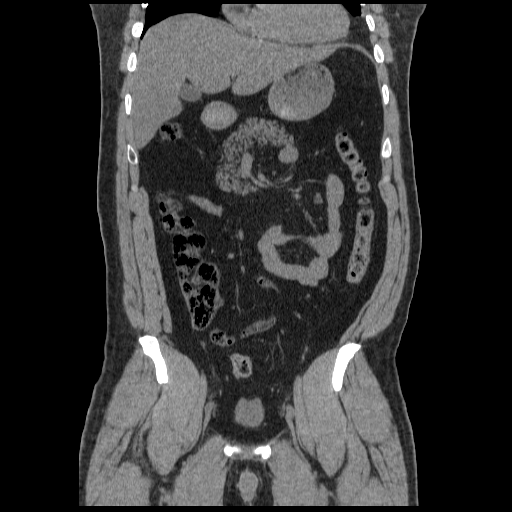
[im 77/138  soft-tissue]
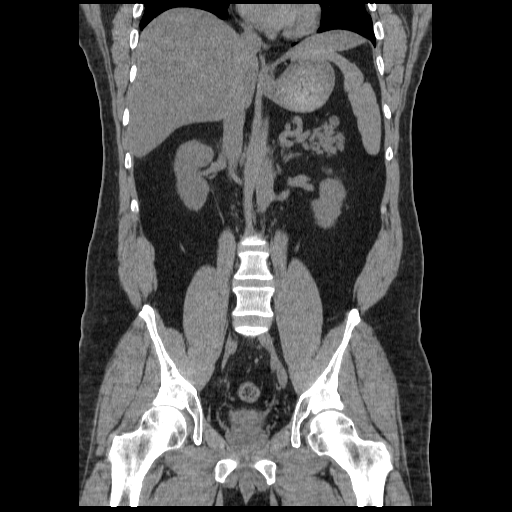

[17 of 46 positions shown; findings below may reference images not displayed]

FINDINGS: Liver is fatty infiltrated.  There is a 3.9 x 3.2 x
cm mass in the right upper/mid kidney abutting the collecting
system without hydronephrosis.  Its margins are not optimally seen
without contrast.  There is slightly decreased central density but
no overt calcification or fat is identified.  Areas of increased
density within the nondilated right renal collecting system and
proximal ureter could indicate hemorrhage, less likely milk of
calcium.  No radiopaque right renal or ureteral calculus.  1 mm
nonobstructing left mid renal calculus identified.  Several small
pericaval lymph nodes are noted, largest 1 cm on image 33.

Gallbladder, adrenal glands, pancreas, and spleen are unremarkable.

Small fat containing left inguinal hernia.  Bowel is normal in
appearance.  No acute bony abnormality.  Sclerotic probable bone
island in the right side of the sacrum. The bladder is normal in
appearance.
IMPRESSION: Right mid/upper renal pole solid mass lesion with blood or less
likely milk of calcium in the nondilated right infrarenal
collecting system and proximal ureter accounting for the history of
hematuria.  Differential considerations primarily include renal
cell carcinoma or transitional cell carcinoma. Oncocytoma or
angiomyolipoma would be less likely.  Further outpatient non
emergent evaluation with dedicated renal mass protocol CT is
recommended.  Findings discussed with Dr. Farfan by Dr. Bellanger on
10/31/2009 at [DATE] a.m.

## 2012-01-31 ENCOUNTER — Ambulatory Visit (INDEPENDENT_AMBULATORY_CARE_PROVIDER_SITE_OTHER): Payer: 59 | Admitting: Internal Medicine

## 2012-01-31 ENCOUNTER — Other Ambulatory Visit (INDEPENDENT_AMBULATORY_CARE_PROVIDER_SITE_OTHER): Payer: 59

## 2012-01-31 ENCOUNTER — Encounter: Payer: Self-pay | Admitting: Internal Medicine

## 2012-01-31 VITALS — BP 128/82 | HR 74 | Temp 98.1°F | Resp 16 | Ht 72.0 in | Wt 265.0 lb

## 2012-01-31 DIAGNOSIS — Z0000 Encounter for general adult medical examination without abnormal findings: Secondary | ICD-10-CM | POA: Insufficient documentation

## 2012-01-31 DIAGNOSIS — E669 Obesity, unspecified: Secondary | ICD-10-CM

## 2012-01-31 DIAGNOSIS — Z23 Encounter for immunization: Secondary | ICD-10-CM | POA: Insufficient documentation

## 2012-01-31 LAB — CBC WITH DIFFERENTIAL/PLATELET
Basophils Absolute: 0 10*3/uL (ref 0.0–0.1)
Basophils Relative: 0.4 % (ref 0.0–3.0)
Eosinophils Relative: 3.2 % (ref 0.0–5.0)
HCT: 44.5 % (ref 39.0–52.0)
Hemoglobin: 14.8 g/dL (ref 13.0–17.0)
Lymphs Abs: 2.6 10*3/uL (ref 0.7–4.0)
MCV: 78.6 fl (ref 78.0–100.0)
Monocytes Relative: 5.1 % (ref 3.0–12.0)
Neutro Abs: 5.7 10*3/uL (ref 1.4–7.7)
Neutrophils Relative %: 62.7 % (ref 43.0–77.0)
Platelets: 296 10*3/uL (ref 150.0–400.0)
RBC: 5.66 Mil/uL (ref 4.22–5.81)
WBC: 9.1 10*3/uL (ref 4.5–10.5)

## 2012-01-31 LAB — COMPREHENSIVE METABOLIC PANEL
ALT: 39 U/L (ref 0–53)
AST: 29 U/L (ref 0–37)
BUN: 14 mg/dL (ref 6–23)
Creatinine, Ser: 1.1 mg/dL (ref 0.4–1.5)
Potassium: 4 mEq/L (ref 3.5–5.1)
Sodium: 136 mEq/L (ref 135–145)
Total Protein: 8 g/dL (ref 6.0–8.3)

## 2012-01-31 LAB — LIPID PANEL
HDL: 31.3 mg/dL — ABNORMAL LOW (ref >39.00)
Triglycerides: 183 mg/dL — ABNORMAL HIGH (ref 0.0–149.0)

## 2012-01-31 NOTE — Patient Instructions (Signed)
Health Maintenance, Males A healthy lifestyle and preventative care can promote health and wellness.  Maintain regular health, dental, and eye exams.  Eat a healthy diet. Foods like vegetables, fruits, whole grains, low-fat dairy products, and lean protein foods contain the nutrients you need without too many calories. Decrease your intake of foods high in solid fats, added sugars, and salt. Get information about a proper diet from your caregiver, if necessary.  Regular physical exercise is one of the most important things you can do for your health. Most adults should get at least 150 minutes of moderate-intensity exercise (any activity that increases your heart rate and causes you to sweat) each week. In addition, most adults need muscle-strengthening exercises on 2 or more days a week.   Maintain a healthy weight. The body mass index (BMI) is a screening tool to identify possible weight problems. It provides an estimate of body fat based on height and weight. Your caregiver can help determine your BMI, and can help you achieve or maintain a healthy weight. For adults 20 years and older:  A BMI below 18.5 is considered underweight.  A BMI of 18.5 to 24.9 is normal.  A BMI of 25 to 29.9 is considered overweight.  A BMI of 30 and above is considered obese.  Maintain normal blood lipids and cholesterol by exercising and minimizing your intake of saturated fat. Eat a balanced diet with plenty of fruits and vegetables. Blood tests for lipids and cholesterol should begin at age 20 and be repeated every 5 years. If your lipid or cholesterol levels are high, you are over 50, or you are a high risk for heart disease, you may need your cholesterol levels checked more frequently.Ongoing high lipid and cholesterol levels should be treated with medicines, if diet and exercise are not effective.  If you smoke, find out from your caregiver how to quit. If you do not use tobacco, do not start.  If you  choose to drink alcohol, do not exceed 2 drinks per day. One drink is considered to be 12 ounces (355 mL) of beer, 5 ounces (148 mL) of wine, or 1.5 ounces (44 mL) of liquor.  Avoid use of street drugs. Do not share needles with anyone. Ask for help if you need support or instructions about stopping the use of drugs.  High blood pressure causes heart disease and increases the risk of stroke. Blood pressure should be checked at least every 1 to 2 years. Ongoing high blood pressure should be treated with medicines if weight loss and exercise are not effective.  If you are 45 to 44 years old, ask your caregiver if you should take aspirin to prevent heart disease.  Diabetes screening involves taking a blood sample to check your fasting blood sugar level. This should be done once every 3 years, after age 45, if you are within normal weight and without risk factors for diabetes. Testing should be considered at a younger age or be carried out more frequently if you are overweight and have at least 1 risk factor for diabetes.  Colorectal cancer can be detected and often prevented. Most routine colorectal cancer screening begins at the age of 50 and continues through age 75. However, your caregiver may recommend screening at an earlier age if you have risk factors for colon cancer. On a yearly basis, your caregiver may provide home test kits to check for hidden blood in the stool. Use of a small camera at the end of a tube,   to directly examine the colon (sigmoidoscopy or colonoscopy), can detect the earliest forms of colorectal cancer. Talk to your caregiver about this at age 50, when routine screening begins. Direct examination of the colon should be repeated every 5 to 10 years through age 75, unless early forms of pre-cancerous polyps or small growths are found.  Hepatitis C blood testing is recommended for all people born from 1945 through 1965 and any individual with known risks for hepatitis C.  Healthy  men should no longer receive prostate-specific antigen (PSA) blood tests as part of routine cancer screening. Consult with your caregiver about prostate cancer screening.  Testicular cancer screening is not recommended for adolescents or adult males who have no symptoms. Screening includes self-exam, caregiver exam, and other screening tests. Consult with your caregiver about any symptoms you have or any concerns you have about testicular cancer.  Practice safe sex. Use condoms and avoid high-risk sexual practices to reduce the spread of sexually transmitted infections (STIs).  Use sunscreen with a sun protection factor (SPF) of 30 or greater. Apply sunscreen liberally and repeatedly throughout the day. You should seek shade when your shadow is shorter than you. Protect yourself by wearing long sleeves, pants, a wide-brimmed hat, and sunglasses year round, whenever you are outdoors.  Notify your caregiver of new moles or changes in moles, especially if there is a change in shape or color. Also notify your caregiver if a mole is larger than the size of a pencil eraser.  A one-time screening for abdominal aortic aneurysm (AAA) and surgical repair of large AAAs by sound wave imaging (ultrasonography) is recommended for ages 65 to 75 years who are current or former smokers.  Stay current with your immunizations. Document Released: 09/21/2007 Document Revised: 06/17/2011 Document Reviewed: 08/20/2010 [employer] Patient Information 2013 [employer].  

## 2012-02-02 ENCOUNTER — Encounter: Payer: Self-pay | Admitting: Internal Medicine

## 2012-02-02 NOTE — Assessment & Plan Note (Signed)
I will check his labs to screen him for secondary causes and complications

## 2012-02-02 NOTE — Assessment & Plan Note (Signed)
Exam done, labs ordered, vaccines were updated, pt ed material was given 

## 2012-02-02 NOTE — Progress Notes (Signed)
  Subjective:    Patient ID: Brian Henson, male    DOB: [birth year], 46 y.o.   MRN: [id number]  HPI  He returns for a complete physical, he feels well and offers no complaints.  Review of Systems  Constitutional: Negative.   HENT: Negative.   Eyes: Negative.   Respiratory: Negative.   Cardiovascular: Negative.   Gastrointestinal: Negative.   Genitourinary: Negative.   Musculoskeletal: Negative.   Skin: Negative.   Neurological: Negative.   Hematological: Negative.   Psychiatric/Behavioral: Negative.        Objective:   Physical Exam  Vitals reviewed. Constitutional: He is oriented to person, place, and time. He appears well-developed and well-nourished. No distress.  HENT:  Head: Normocephalic and atraumatic.  Mouth/Throat: Oropharynx is clear and moist. No oropharyngeal exudate.  Eyes: Conjunctivae normal are normal. Right eye exhibits no discharge. Left eye exhibits no discharge. No scleral icterus.  Neck: Normal range of motion. Neck supple. No JVD present. No tracheal deviation present. No thyromegaly present.  Cardiovascular: Normal rate, regular rhythm, normal heart sounds and intact distal pulses.  Exam reveals no gallop and no friction rub.   No murmur heard. Pulmonary/Chest: Effort normal and breath sounds normal. No stridor. No respiratory distress. He has no wheezes. He has no rales. He exhibits no tenderness.  Abdominal: Soft. Bowel sounds are normal. He exhibits no distension and no mass. There is no tenderness. There is no rebound and no guarding.  Musculoskeletal: Normal range of motion. He exhibits no edema and no tenderness.  Lymphadenopathy:    He has no cervical adenopathy.  Neurological: He is oriented to person, place, and time.  Skin: Skin is warm and dry. No rash noted. He is not diaphoretic. No erythema. No pallor.  Psychiatric: He has a normal mood and affect. His behavior is normal. Judgment and thought content normal.          Assessment &  Plan:

## 2012-02-18 ENCOUNTER — Telehealth: Payer: Self-pay | Admitting: Internal Medicine

## 2012-02-18 ENCOUNTER — Ambulatory Visit (INDEPENDENT_AMBULATORY_CARE_PROVIDER_SITE_OTHER): Payer: 59 | Admitting: Internal Medicine

## 2012-02-18 ENCOUNTER — Encounter: Payer: Self-pay | Admitting: Internal Medicine

## 2012-02-18 VITALS — BP 112/84 | HR 71 | Temp 98.0°F | Ht 72.0 in

## 2012-02-18 DIAGNOSIS — R202 Paresthesia of skin: Secondary | ICD-10-CM

## 2012-02-18 DIAGNOSIS — R209 Unspecified disturbances of skin sensation: Secondary | ICD-10-CM

## 2012-02-18 NOTE — Patient Instructions (Addendum)
It was good to see you today. We have reviewed your prior records including labs and tests today Medications reviewed, no changes at this time. we'll make referral for MRI thoracic and lumbar spine as discussed . Also nerve conduction study of lower legs. Our office will contact you regarding appointment(s) once made. Treatment will depend on your symptoms and test results -

## 2012-02-18 NOTE — Progress Notes (Signed)
Subjective:    Patient ID: Brian Henson, male    DOB: 06/01/1965, 46 y.o.   MRN: [id number]  HPI  complains of paraesthesia -  Onset 2.5 days ago, progressively worse Describes numbness and burning over B flank radiating to sides but sparing anterior abdomen "Belt-like" sensation now involves B thighs (anterior> posterior) and L>R in past 12h (since going to be last PM) Denies motor weakness - no difficulty with balance, gait or standing from seated position Preceding URI/sinusitus last week and immunizations for flu and pneumovac 01/31/12  Past Medical History  Diagnosis Date  . Allergy   . Malignant neoplasm of kidney, except pelvis   . Chronic tension type headache   . Headache     Review of Systems  Constitutional: Positive for fatigue. Negative for fever and unexpected weight change.  Respiratory: Negative for chest tightness and shortness of breath.   Cardiovascular: Negative for chest pain, palpitations and leg swelling.  Gastrointestinal: Negative for nausea, vomiting, abdominal pain and diarrhea.  Musculoskeletal: Positive for myalgias and back pain (mild). Negative for joint swelling and gait problem.  Skin: Negative for color change, pallor, rash and wound.  Neurological: Positive for numbness. Negative for dizziness, tremors, facial asymmetry, weakness and light-headedness.  Hematological: Does not bruise/bleed easily.  Psychiatric/Behavioral: Negative for behavioral problems and confusion.       Objective:   Physical Exam BP 112/84  Pulse 71  Temp 98 F (36.7 C) (Oral)  Ht 6' (1.829 m)  SpO2 97% Constitutional:  He appears well-developed and well-nourished. No distress.  Neck: Normal range of motion. Neck supple. No JVD present. No thyromegaly present.  Cardiovascular: Normal rate, regular rhythm and normal heart sounds.  No murmur heard. no BLE edema Pulmonary/Chest: Effort normal and breath sounds normal. No respiratory distress. no wheezes.    Abdominal: Soft. Bowel sounds are normal. Patient exhibits no distension. There is no tenderness.  Musculoskeletal: Normal range of motion. Patient exhibits no gross deformity. Back: full range of motion of thoracic and lumbar spine. Non tender to palpation. Negative straight leg raise. DTR's are symmetrically diminished at B patella. Sensation appears intact in all dermatomes of the lower extremities, but reports paraesthesias B flank and thighs, L>R. Full strength to manual muscle testing. patient is able to heel toe walk without difficulty and ambulates with normal gait.  Neurological: he is alert and oriented to person, place, and time. No cranial nerve deficit. Motor 5/5 BLE at quads, hams and  Heel/toe walk without difficulty -Coordination normal.  Skin: Skin is warm and dry.  No rash, bruise, erythema or ulceration.  Psychiatric: he has a normal mood and affect. behavior is normal. Judgment and thought content normal.   Lab Results  Component Value Date   WBC 9.1 01/31/2012   HGB 14.8 01/31/2012   HCT 44.5 01/31/2012   PLT 296.0 01/31/2012   GLUCOSE 79 01/31/2012   CHOL 180 01/31/2012   TRIG 183.0* 01/31/2012   HDL 31.30* 01/31/2012   LDLCALC 112* 01/31/2012   ALT 39 01/31/2012   AST 29 01/31/2012   NA 136 01/31/2012   K 4.0 01/31/2012   CL 102 01/31/2012   CREATININE 1.1 01/31/2012   BUN 14 01/31/2012   CO2 27 01/31/2012   TSH 1.34 01/31/2012   HGBA1C 5.7 01/31/2012       Assessment & Plan:   Acute paresthesia concerning for ?GBS variant or transverse myelitis-  B lower thoracic to bilateral thigh distribution, L>R and low back pain  No evidence  of motor deficit on neuro exam  Recent immunizations 10/25 and URI symptoms reviewed (now resolved)  Check MRI T-L spine and NCS - today Motor exam intact Recent labs reviewed symptomatic support and education on DDx provided - pt agrees to call or go to ER if symptoms worse during evaluation

## 2012-02-18 NOTE — Telephone Encounter (Signed)
Patient calling about a numbness and tingling sensation just about his waist band on both sides to his back.  Had fever, chills and nausea last week.  States that his children had the nausea and vomiting and fever last week also.  No rash seen on his back/side area.   The numbness and tingling is constant.   Triaged per Numbness/Tingling protocal.  Needs to be seen in 72 hours.   He is flexible with his scheduled today only until Friday 11/15.  Scheduled at 1130 wtih Dr. Felicity Coyer.

## 2012-02-19 ENCOUNTER — Ambulatory Visit
Admission: RE | Admit: 2012-02-19 | Discharge: 2012-02-19 | Disposition: A | Discharge status: Home / Self Care | Payer: 59 | Source: Ambulatory Visit | Attending: Internal Medicine | Admitting: Internal Medicine

## 2012-02-19 ENCOUNTER — Telehealth: Payer: Self-pay | Admitting: *Deleted

## 2012-02-19 DIAGNOSIS — R202 Paresthesia of skin: Secondary | ICD-10-CM

## 2012-02-19 MED ORDER — GADOBENATE DIMEGLUMINE 529 MG/ML IV SOLN
20.0000 mL | Freq: Once | INTRAVENOUS | Status: AC | PRN
  Administered 2012-02-19: 20 mL via INTRAVENOUS

## 2012-02-19 NOTE — Telephone Encounter (Signed)
Left msg on vm requesting MRI results...Raechel Chute

## 2012-02-19 NOTE — Telephone Encounter (Signed)
As noted on result note:  Notes Recorded by Newt Lukes, MD on 02/19/2012 at 3:02 PM Please call pt - MRI thoracic and lumbar spine appear normal - no inflammation, infection, mass or other abnormality to explain numbness symptoms - Proceed with nerve conduction studies as planned and call if symptoms worse - no tx change recommended

## 2012-02-20 NOTE — Telephone Encounter (Signed)
Please check with [facility]

## 2012-02-20 NOTE — Telephone Encounter (Signed)
Notified pt with md response. Pt states he haven't heard about the nerve conduction test yet. Has the test been ordered...Raechel Chute

## 2012-02-20 NOTE — Telephone Encounter (Signed)
Pcc did received referral. Notes has been sent to [facility] & once there md review they will contact pt with appt. Notified pt with info...Brian Henson

## 2012-02-28 ENCOUNTER — Ambulatory Visit: Payer: 59 | Admitting: *Deleted

## 2012-06-02 ENCOUNTER — Ambulatory Visit: Payer: 59 | Admitting: Internal Medicine

## 2012-06-13 ENCOUNTER — Ambulatory Visit (INDEPENDENT_AMBULATORY_CARE_PROVIDER_SITE_OTHER): Payer: 59 | Admitting: Family Medicine

## 2012-06-13 ENCOUNTER — Encounter: Payer: Self-pay | Admitting: Family Medicine

## 2012-06-13 VITALS — BP 138/94 | HR 86 | Temp 97.4°F | Wt 267.0 lb

## 2012-06-13 DIAGNOSIS — J019 Acute sinusitis, unspecified: Secondary | ICD-10-CM | POA: Insufficient documentation

## 2012-06-13 MED ORDER — AMOXICILLIN-POT CLAVULANATE 875-125 MG PO TABS: 1.0000 | ORAL_TABLET | Freq: Two times a day (BID) | ORAL | Status: DC

## 2012-06-13 NOTE — Assessment & Plan Note (Signed)
Augmentin 875/125, 1 bid x 10d. Continue current symptomatic care. Signs/symptoms to call or return for were reviewed and pt expressed understanding.

## 2012-06-13 NOTE — Progress Notes (Signed)
OFFICE NOTE  06/13/2012  CC:  Chief Complaint  Patient presents with  . Pt here with c/o URI infection, pt states sypmtoms started a     HPI: Patient is a 47 y.o. Caucasian male who is here for respiratory complaints. Pt presents complaining of respiratory symptoms for 6  days.  Primary symptoms are: nasal congestion/mucous, PND, fullness of ears, head pressure, hoarse, some cough occ but no wheeze.  Worst symptoms seems to be the head congestion.  Lately the symptoms seem to be worsening.  Some mild body aches and some subjective f/c 2 nights ago. Pertinent negatives: No fevers, no wheezing, and no SOB.  No pain in face or teeth.  No significant HA.  ST mild at most.   Symptoms made worse by night, supine position.  Symptoms improved by nothing: tried claritin D instead of xyzal and this helped some.  He describes a bit of a "double sickening" course. Smoker? no Recent sick contact? unknown Flu shot this season at least 2 wks ago? yes  Additional ROS: no n/v/d or abdominal pain.  No rash.  No neck stiffness.   +Mild fatigue.  +Mild appetite loss.   Pertinent PMH:  Past Medical History  Diagnosis Date  . Allergy   . Malignant neoplasm of kidney, except pelvis   . Chronic tension type headache   . Headache   Hx of deviated septum--surgery  MEDS:  Outpatient Prescriptions Prior to Visit  Medication Sig Dispense Refill  . fluticasone (VERAMYST) 27.5 MCG/SPRAY nasal spray Place 2 sprays into the nose daily.      . Fluticasone-Salmeterol (ADVAIR DISKUS) 250-50 MCG/DOSE AEPB Inhale 1 puff into the lungs 2 (two) times daily.  60 each  11  . levocetirizine (XYZAL) 5 MG tablet Take 5 mg by mouth every evening.      . montelukast (SINGULAIR) 10 MG tablet Take 10 mg by mouth at bedtime.       No facility-administered medications prior to visit.    PE: Blood pressure 138/94, pulse 86, temperature 97.4 F (36.3 C), temperature source Oral, weight 267 lb (121.11 kg), SpO2 96.00%. VS:  noted--normal. Gen: alert, NAD, NONTOXIC APPEARING. HEENT: eyes without injection, drainage, or swelling.  Ears: EACs clear, TMs with normal light reflex and landmarks.  Nose: Clear rhinorrhea, with some dried, crusty exudate adherent to mildly injected mucosa.  No purulent d/c.  Diffuse mild paranasal sinus TTP.  No facial swelling.  Throat and mouth without focal lesion.  No pharyngial swelling, erythema, or exudate.   Neck: supple, no LAD.   LUNGS: CTA bilat, nonlabored resps.   CV: RRR, no m/r/g. EXT: no c/c/e SKIN: no rash    IMPRESSION AND PLAN:  Sinusitis, acute Augmentin 875/125, 1 bid x 10d. Continue current symptomatic care. Signs/symptoms to call or return for were reviewed and pt expressed understanding.    An After Visit Summary was printed and given to the patient.   FOLLOW UP: prn

## 2012-06-15 ENCOUNTER — Telehealth: Payer: Self-pay | Admitting: Internal Medicine

## 2012-06-15 NOTE — Telephone Encounter (Signed)
[facility] Triage Call Report Triage Record Num: [id number] Operator: Griselda Miner [name] Call Date & Time: 06/13/2012 8:35:49AM Patient Phone: [phone] PCP: [facility] Patient Gender: Male PCP Fax : [phone] Patient DOB: 1965-04-16 Practice Name: [facility] Reason for Call: Caller: Melanie/Patient; PCP: Sanda Linger (Adults only); CB#: [phone]; Call regarding Thinks He Has A. Sinus Infection and Has Body Aches and Dark Mucus; Afebrile. Is coughing up brown mucus. Continues to cough, but unable to blow out anything from nose. Was hoarse when first got up. Triaged using Upper respiratory infection with a disposition to be seen within 24 hours. Care advice given by RN yesterday during triage. Appointment scheduled for 09:15 06/13/12 at [facility] demonstrated his understanding. Protocol(s) Used: Upper Respiratory Infection (URI) Recommended Outcome per Protocol: See Provider within 24 hours Reason for Outcome: Productive cough with colored sputum (other than clear or white sputum)

## 2012-06-15 NOTE — Telephone Encounter (Signed)
[facility] Triage Call Report Triage Record Num: [id number] Operator: Griselda Miner [name] Call Date & Time: 06/12/2012 2:30:11PM Patient Phone: [phone] PCP: [place] Patient Gender: Male PCP Fax : [phone] Patient DOB: Oct 29, 1965 [facility] Name: [facility] Reason for Call: [name]; PCP: Sanda Linger (Adults only); CB#: [phone]; Call regarding Sinus Infection; Onset of symptoms on 06/08/12. Congested in ears; Is blowing a darker yellow from nose and coughing it up. Afebrile, but is having body aches. Also has the sore throat rates it as discomfort with ear pain as moderate. Has taken Tylenol Sinus Cold and Claritin D. Triaged using Upper Respiratory Infection with a disposition to be seen within 24 hours due to productive cough with colored sputum. Appointment can not be scheduled for tomorrow until morning so patient will call back. Care advice given with caller demonstrating his understanding. Protocol(s) Used: Upper Respiratory Infection (URI) Recommended Outcome per Protocol: See Provider within 24 hours Reason for Outcome: Productive cough with colored sputum (other than clear or white sputum) Care Advice: ~ Use a cool mist humidifier to moisten air. Be sure to clean according to manufacturer's instructions. ~ May inhale steam from hot shower or heated water. Be careful to avoid burns. Drink more fluids -- water, low-sugar juices, tea and warm soup, especially chicken broth, are options. Avoid caffeinated or alcoholic beverages because they can increase the chance of dehydration. ~Coughing up mucus or phlegm helps to get rid of an infection. A productive cough should not be stopped. A cough medicine with guaifenesin (Robitussin, Mucinex) can help loosen the mucus. Cough medicine with dextromethorphan (DM) should be avoided. Drinking lots of fluids can help loosen the mucus too, especially warm fluids. ~Analgesic/Antipyretic Advice  - Acetaminophen: Consider acetaminophen as directed on label or by pharmacist/provider for pain or fever PRECAUTIONS: - Use if there is no history of liver disease, alcoholism, or intake of three or more alcohol drinks per day - Only if approved by provider during pregnancy or when breastfeeding - During pregnancy, acetaminophen should not be taken more than 3 consecutive days without telling provider - Do not exceed recommended dose or frequency ~Analgesic/Antipyretic Advice - NSAIDs: Consider aspirin, ibuprofen, naproxen or ketoprofen for pain or fever as directed on label or by pharmacist/provider. PRECAUTIONS: - If over 24 years of age, should not take longer than 1 week without consulting provider. EXCEPTIONS: - Should not be used if taking blood thinners or have bleeding problems. - Do not use if have history of sensitivity/allergy to any of these medications; or history of cardiovascular, ulcer, kidney, liver disease or diabetes unless approved by provider. - Do not exceed recommended dose or frequency. ~Total water intake includes drinking water, water in beverages, and water contained in food. Fluids make up about 80% of the body's total hydration need. Individual fluid requirement to maintain hydration vary based on physical activity, environmental factors and illness. Limit fluids that contain sugar, caffeine, or alcohol. Urine will be very light yellow color when you drink enough fluids.

## 2013-06-14 ENCOUNTER — Ambulatory Visit (INDEPENDENT_AMBULATORY_CARE_PROVIDER_SITE_OTHER): Payer: 59 | Admitting: Internal Medicine

## 2013-06-14 ENCOUNTER — Encounter: Payer: Self-pay | Admitting: Internal Medicine

## 2013-06-14 VITALS — BP 138/88 | HR 99 | Temp 100.3°F | Resp 14 | Wt 273.0 lb

## 2013-06-14 DIAGNOSIS — J019 Acute sinusitis, unspecified: Secondary | ICD-10-CM

## 2013-06-14 MED ORDER — AMOXICILLIN 500 MG PO CAPS: 500.0000 mg | ORAL_CAPSULE | Freq: Three times a day (TID) | ORAL | Status: AC

## 2013-06-14 NOTE — Patient Instructions (Addendum)
Plain Mucinex (NOT D) for thick secretions ;force NON dairy fluids .   Nasal cleansing in the shower as discussed with lather of mild shampoo.After 10 seconds wash off lather while  exhaling through nostrils. Make sure that all residual soap is removed to prevent irritation.  Veramyst 1 spray in each nostril twice a day as needed. Use the "crossover" technique into opposite nostril spraying toward opposite ear @ 45 degree angle, not straight up into nostril.  Use a Neti pot daily only  as needed for significant sinus congestion; going from open side to congested side . Plain Allegra (NOT D )  160 daily , Loratidine 10 mg , OR Zyrtec 10 mg @ bedtime  as needed for itchy eyes & sneezing. Use Eucerin or another moisturizing agent  twice a day  for the drying. If there is itching; the moisturizing agent can be mixed one part to one part of Cort Aid  and applied twice a day as needed.

## 2013-06-14 NOTE — Progress Notes (Signed)
Pre visit review using our clinic review tool, if applicable. No additional management support is needed unless otherwise documented below in the visit note. 

## 2013-06-14 NOTE — Progress Notes (Signed)
   Subjective:    Patient ID: Brian Henson, male    DOB: 1965-08-01, 48 y.o.   MRN: [id number]  HPI   He was bedridden 06/03/13 with severe myalgias and arthralgias. He also had rhinitis, sore throat, head congestion, fever, chills, and sweats.  He did take the flu shot in November. He had been exposed to sick associates and friends prior to present illness.  He's taking nonsteroidals and decongestants with some relief  At this time he describes a frontal headache, facial pain, dental pain, sore throat, fever to 101, and arthralgias and myalgias.  He has been having significant epistaxis over the last week. This has been a problem in the past with nasal drying    Review of Systems   He is not having nasal purulence, otic pain, or otic discharge.  He also denies sputum production, wheezing, or shortness of breath.  Extrinsic symptoms of itchy, watery eyes, sneezing are not significant.     Objective:   Physical Exam General appearance:good health ;well nourished; no acute distress or increased work of breathing is present.  No  lymphadenopathy about the head, neck, or axilla noted.   Eyes: No conjunctival inflammation or lid edema is present.   Ears:  External ear exam shows no significant lesions or deformities.  Otoscopic examination reveals clear canals, tympanic membranes are intact bilaterally without bulging, retraction, inflammation or discharge.  Nose:  External nasal examination shows no deformity or inflammation. Nasal mucosa are erythematous with dried heme on R but  without lesions or exudates. No septal dislocation or deviation.No obstruction to airflow.   Oral exam: Dental hygiene is good; lips and gums are healthy appearing.There is no oropharyngeal erythema or exudate noted.   Neck:  No deformities, masses, or tenderness noted.      Heart:  Normal rate and regular rhythm. S1 and S2 normal without gallop, murmur, click, rub or other extra sounds.   Lungs:Chest  clear to auscultation; no wheezes, rhonchi,rales ,or rubs present.No increased work of breathing.    Extremities:  No cyanosis, edema, or clubbing  noted    Skin: Warm & dry          Assessment & Plan:  #1 rhinosinusitis without significant bronchitis  Plan: Nasal hygiene interventions discussed. See prescription medications

## 2015-06-01 ENCOUNTER — Other Ambulatory Visit (INDEPENDENT_AMBULATORY_CARE_PROVIDER_SITE_OTHER): Payer: Self-pay | Admitting: Otolaryngology

## 2015-06-01 DIAGNOSIS — J0101 Acute recurrent maxillary sinusitis: Secondary | ICD-10-CM

## 2015-06-06 ENCOUNTER — Ambulatory Visit
Admission: RE | Admit: 2015-06-06 | Discharge: 2015-06-06 | Disposition: A | Discharge status: Home / Self Care | Payer: 59 | Source: Ambulatory Visit | Attending: Otolaryngology | Admitting: Otolaryngology

## 2015-06-06 DIAGNOSIS — J0101 Acute recurrent maxillary sinusitis: Secondary | ICD-10-CM
